# Patient Record
Sex: Female | Born: 1995 | Race: Black or African American | Hispanic: No | Marital: Single | Smoking: Never smoker
Health system: Southern US, Community
[De-identification: ages and names within clinical notes are randomized; demographics above are authoritative.]

## PROBLEM LIST (undated history)

## (undated) DIAGNOSIS — Z789 Other specified health status: Secondary | ICD-10-CM

## (undated) HISTORY — PX: ANTERIOR CRUCIATE LIGAMENT REPAIR: SHX115

## (undated) HISTORY — DX: Other specified health status: Z78.9

---

## 2017-04-05 ENCOUNTER — Encounter (HOSPITAL_COMMUNITY): Payer: Self-pay | Admitting: Family Medicine

## 2017-04-05 ENCOUNTER — Ambulatory Visit (HOSPITAL_COMMUNITY)
Admission: EM | Admit: 2017-04-05 | Discharge: 2017-04-05 | Disposition: A | Discharge status: Home / Self Care | Payer: No Typology Code available for payment source | Attending: Family Medicine | Admitting: Family Medicine

## 2017-04-05 DIAGNOSIS — J0100 Acute maxillary sinusitis, unspecified: Secondary | ICD-10-CM

## 2017-04-05 MED ORDER — AMOXICILLIN 875 MG PO TABS: 875.0000 mg | ORAL_TABLET | Freq: Two times a day (BID) | ORAL | 0 refills | Status: AC

## 2017-04-05 NOTE — ED Triage Notes (Signed)
Pt here for sinus congestion and facial pain x 3 weeks.

## 2017-04-05 NOTE — ED Provider Notes (Signed)
[facility]    CSN: [id number] Arrival date & time: 04/05/17  1308     History   Chief Complaint Chief Complaint  Patient presents with  . Sinus Problem    HPI Marisa Perez is a 21 y.o. female.   This is a 21 year old woman who comes to the urgent care center at [facility] for evaluation of sinus congestion and discomfort. She's had sinus congestion, intermittent epistaxis, and right facial and for headache pain for 3 weeks. She has not had problems with allergies in the past. She's been taking only ibuprofen to control the symptoms.  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 No pertinent past medical history.  There are no active problems to display for this patient.   History reviewed. No pertinent surgical history.  OB History    No data available       Home Medications    Prior to Admission medications   Medication Sig Start Date End Date Taking? Authorizing Provider  amoxicillin (AMOXIL) 875 MG tablet Take 1 tablet (875 mg total) by mouth 2 (two) times daily. 04/05/17   Elvina Sidle, MD    Family History History reviewed. No pertinent family history.  Social History Social History  Substance Use Topics  . Smoking status: Not on file  . Smokeless tobacco: Not on file  . Alcohol use Not on file     Allergies   Patient has no known allergies.   Review of Systems Review of Systems  Constitutional: Negative.   HENT: Positive for congestion, nosebleeds, sinus pain and sinus pressure.      Physical Exam Triage Vital Signs ED Triage Vitals  Enc Vitals Group     BP      Pulse      Resp      Temp      Temp src      SpO2      Weight      Height      Head Circumference      Peak Flow      Pain Score      Pain Loc      Pain Edu?      Excl. in GC?    No data found.   Updated Vital Signs BP (!) 142/65   Pulse 77   Temp 98.6 F (37  C)   Resp 18   SpO2 100%    Physical Exam  Constitutional: She is oriented to person, place, and time. She appears well-developed and well-nourished.  HENT:  Right Ear: External ear normal.  Left Ear: External ear normal.  Mouth/Throat: Oropharynx is clear and moist.  Marked intranasal edema with purulent nasal discharge  Eyes: Pupils are equal, round, and reactive to light. Conjunctivae are normal.  Neck: Normal range of motion. Neck supple.  Pulmonary/Chest: Effort normal.  Musculoskeletal: Normal range of motion.  Neurological: She is alert and oriented to person, place, and time.  Skin: Skin is warm and dry.  Nursing note and vitals reviewed.    UC Treatments / Results  Labs (all labs ordered are listed, but only abnormal results are displayed) Labs Reviewed - No data to display  EKG  EKG Interpretation None       Radiology No results found.  Procedures Procedures (including critical care time)  Medications Ordered in UC Medications - No data to display   Initial Impression / Assessment and  Plan / UC Course  I have reviewed the triage vital signs and the nursing notes.  Pertinent labs & imaging results that were available during my care of the patient were reviewed by me and considered in my medical decision making (see chart for details).     Final Clinical Impressions(s) / UC Diagnoses   Final diagnoses:  Acute maxillary sinusitis, recurrence not specified    New Prescriptions New Prescriptions   AMOXICILLIN (AMOXIL) 875 MG TABLET    Take 1 tablet (875 mg total) by mouth 2 (two) times daily.     Controlled Substance Prescriptions [place] Controlled Substance Registry consulted? Not Applicable   Elvina Sidle, MD 04/05/17 1339

## 2018-01-24 ENCOUNTER — Emergency Department (HOSPITAL_COMMUNITY): Admission: EM | Admit: 2018-01-24 | Discharge: 2018-01-24 | Discharge status: Against Medical Advice | Payer: Self-pay

## 2018-01-24 NOTE — ED Notes (Signed)
Pt called x2. Went in restroom and called pt name. No response.

## 2018-09-09 ENCOUNTER — Ambulatory Visit (INDEPENDENT_AMBULATORY_CARE_PROVIDER_SITE_OTHER): Payer: Medicaid Other | Admitting: Advanced Practice Midwife

## 2018-09-09 ENCOUNTER — Encounter: Payer: Self-pay | Admitting: Advanced Practice Midwife

## 2018-09-09 ENCOUNTER — Other Ambulatory Visit (HOSPITAL_COMMUNITY)
Admission: RE | Admit: 2018-09-09 | Discharge: 2018-09-09 | Disposition: A | Discharge status: Home / Self Care | Payer: Medicaid Other | Source: Ambulatory Visit | Attending: Advanced Practice Midwife | Admitting: Advanced Practice Midwife

## 2018-09-09 VITALS — BP 125/75 | HR 79 | Ht 63.5 in | Wt 159.8 lb

## 2018-09-09 DIAGNOSIS — Z3A01 Less than 8 weeks gestation of pregnancy: Secondary | ICD-10-CM | POA: Diagnosis not present

## 2018-09-09 DIAGNOSIS — Z3400 Encounter for supervision of normal first pregnancy, unspecified trimester: Secondary | ICD-10-CM | POA: Insufficient documentation

## 2018-09-09 DIAGNOSIS — O209 Hemorrhage in early pregnancy, unspecified: Secondary | ICD-10-CM

## 2018-09-09 LAB — POCT URINALYSIS DIPSTICK OB
Glucose, UA: NEGATIVE
LEUKOCYTES UA: NEGATIVE
PH UA: 7 (ref 5.0–8.0)
PROTEIN: NEGATIVE
Spec Grav, UA: 1.015 (ref 1.010–1.025)

## 2018-09-09 NOTE — Patient Instructions (Signed)
Prenatal Care Prenatal care is health care during pregnancy. It helps you and your unborn baby (fetus) stay as healthy as possible. Prenatal care may be provided by a midwife, a family practice health care provider, or a childbirth and pregnancy specialist (obstetrician). How does this affect me? During pregnancy, you will be closely monitored for any new conditions that might develop. To lower your risk of pregnancy complications, you and your health care provider will talk about any underlying conditions you have. How does this affect my baby? Early and consistent prenatal care increases the chance that your baby will be healthy during pregnancy. Prenatal care lowers the risk that your baby will be:  Born early (prematurely).  Smaller than expected at birth (small for gestational age). What can I expect at the first prenatal care visit? Your first prenatal care visit will likely be the longest. You should schedule your first prenatal care visit as soon as you know that you are pregnant. Your first visit is a good time to talk about any questions or concerns you have about pregnancy. At your visit, you and your health care provider will talk about:  Your medical history, including: ? Any past pregnancies. ? Your family's medical history. ? The baby's father's medical history. ? Any long-term (chronic) health conditions you have and how you manage them. ? Any surgeries or procedures you have had. ? Any current over-the-counter or prescription medicines, herbs, or supplements you are taking.  Other factors that could pose a risk to your baby, including:  Your home setting and your stress levels, including: ? Exposure to abuse or violence. ? Household financial strain. ? Mental health conditions you have.  Your daily health habits, including diet and exercise. Your health care provider will also:  Measure your weight, height, and blood pressure.  Do a physical exam, including a pelvic  and breast exam.  Perform blood tests and urine tests to check for: ? Urinary tract infection. ? Sexually transmitted infections (STIs). ? Low iron levels in your blood (anemia). ? Blood type and certain proteins on red blood cells (Rh antibodies). ? Infections and immunity to viruses, such as hepatitis B and rubella. ? HIV (human immunodeficiency virus).  Do an ultrasound to confirm your baby's growth and development and to help predict your estimated due date (EDD). This ultrasound is done with a probe that is inserted into the vagina (transvaginal ultrasound).  Discuss your options for genetic screening.  Give you information about how to keep yourself and your baby healthy, including: ? Nutrition and taking vitamins. ? Physical activity. ? How to manage pregnancy symptoms such as nausea and vomiting (morning sickness). ? Infections and substances that may be harmful to your baby and how to avoid them. ? Food safety. ? Dental care. ? Working. ? Travel. ? Warning signs to watch for and when to call your health care provider. How often will I have prenatal care visits? After your first prenatal care visit, you will have regular visits throughout your pregnancy. The visit schedule is often as follows:  Up to week 28 of pregnancy: once every 4 weeks.  28-36 weeks: once every 2 weeks.  After 36 weeks: every week until delivery. Some women may have visits more or less often depending on any underlying health conditions and the health of the baby. Keep all follow-up and prenatal care visits as told by your health care provider. This is important. What happens during routine prenatal care visits? Your health care provider will:    Measure your weight and blood pressure.  Check for fetal heart sounds.  Measure the height of your uterus in your abdomen (fundal height). This may be measured starting around week 20 of pregnancy.  Check the position of your baby inside your  uterus.  Ask questions about your diet, sleeping patterns, and whether you can feel the baby move.  Review warning signs to watch for and signs of labor.  Ask about any pregnancy symptoms you are having and how you are dealing with them. Symptoms may include: ? Headaches. ? Nausea and vomiting. ? Vaginal discharge. ? Swelling. ? Fatigue. ? Constipation. ? Any discomfort, including back or pelvic pain. Make a list of questions to ask your health care provider at your routine visits. What tests might I have during prenatal care visits? You may have blood, urine, and imaging tests throughout your pregnancy, such as:  Urine tests to check for glucose, protein, or signs of infection.  Glucose tests to check for a form of diabetes that can develop during pregnancy (gestational diabetes mellitus). This is usually done around week 24 of pregnancy.  An ultrasound to check your baby's growth and development and to check for birth defects. This is usually done around week 20 of pregnancy.  A test to check for group B strep (GBS) infection. This is usually done around week 36 of pregnancy.  Genetic testing. This may include blood or imaging tests, such as an ultrasound. Some genetic tests are done during the first trimester and some are done during the second trimester. What else can I expect during prenatal care visits? Your health care provider may recommend getting certain vaccines during pregnancy. These may include:  A yearly flu shot (annual influenza vaccine). This is especially important if you will be pregnant during flu season.  Tdap (tetanus, diphtheria, pertussis) vaccine. Getting this vaccine during pregnancy can protect your baby from whooping cough (pertussis) after birth. This vaccine may be recommended between weeks 27 and 36 of pregnancy. Later in your pregnancy, your health care provider may give you information about:  Childbirth and breastfeeding classes.  Choosing a  health care provider for your baby.  Umbilical cord banking.  Breastfeeding.  Birth control after your baby is born.  The hospital labor and delivery unit and how to tour it.  Registering at the hospital before you go into labor. Where to find more information  Office on Women's Health: TravelLesson.ca  American Pregnancy Association: americanpregnancy.org  March of Dimes: marchofdimes.org Summary  Prenatal care helps you and your baby stay as healthy as possible during pregnancy.  Your first prenatal care visit will most likely be the longest.  You will have visits and tests throughout your pregnancy to monitor your health and your baby's health.  Bring a list of questions to your visits to ask your health care provider.  Make sure to keep all follow-up and prenatal care visits with your health care provider. This information is not intended to replace advice given to you by your health care provider. Make sure you discuss any questions you have with your health care provider. Document Released: 08/09/2003 Document Revised: 08/05/2017 Document Reviewed: 08/05/2017 Elsevier Interactive Patient Education  2019 [employer]. First Trimester of Pregnancy  The first trimester of pregnancy is from week 1 until the end of week 13 (months 1 through 3). During this time, your baby will begin to develop inside you. At 6-8 weeks, the eyes and face are formed, and the heartbeat can be seen  on ultrasound. At the end of 12 weeks, all the baby's organs are formed. Prenatal care is all the medical care you receive before the birth of your baby. Make sure you get good prenatal care and follow all of your doctor's instructions. Follow these instructions at home: Medicines  Take over-the-counter and prescription medicines only as told by your doctor. Some medicines are safe and some medicines are not safe during pregnancy.  Take a prenatal vitamin that contains at least 600 micrograms (mcg)  of folic acid.  If you have trouble pooping (constipation), take medicine that will make your stool soft (stool softener) if your doctor approves. Eating and drinking   Eat regular, healthy meals.  Your doctor will tell you the amount of weight gain that is right for you.  Avoid raw meat and uncooked cheese.  If you feel sick to your stomach (nauseous) or throw up (vomit): ? Eat 4 or 5 small meals a day instead of 3 large meals. ? Try eating a few soda crackers. ? Drink liquids between meals instead of during meals.  To prevent constipation: ? Eat foods that are high in fiber, like fresh fruits and vegetables, whole grains, and beans. ? Drink enough fluids to keep your pee (urine) clear or pale yellow. Activity  Exercise only as told by your doctor. Stop exercising if you have cramps or pain in your lower belly (abdomen) or low back.  Do not exercise if it is too hot, too humid, or if you are in a place of great height (high altitude).  Try to avoid standing for long periods of time. Move your legs often if you must stand in one place for a long time.  Avoid heavy lifting.  Wear low-heeled shoes. Sit and stand up straight.  You can have sex unless your doctor tells you not to. Relieving pain and discomfort  Wear a good support bra if your breasts are sore.  Take warm water baths (sitz baths) to soothe pain or discomfort caused by hemorrhoids. Use hemorrhoid cream if your doctor says it is okay.  Rest with your legs raised if you have leg cramps or low back pain.  If you have puffy, bulging veins (varicose veins) in your legs: ? Wear support hose or compression stockings as told by your doctor. ? Raise (elevate) your feet for 15 minutes, 3-4 times a day. ? Limit salt in your food. Prenatal care  Schedule your prenatal visits by the twelfth week of pregnancy.  Write down your questions. Take them to your prenatal visits.  Keep all your prenatal visits as told by your  doctor. This is important. Safety  Wear your seat belt at all times when driving.  Make a list of emergency phone numbers. The list should include numbers for family, friends, the hospital, and police and fire departments. General instructions  Ask your doctor for a referral to a local prenatal class. Begin classes no later than at the start of month 6 of your pregnancy.  Ask for help if you need counseling or if you need help with nutrition. Your doctor can give you advice or tell you where to go for help.  Do not use hot tubs, steam rooms, or saunas.  Do not douche or use tampons or scented sanitary pads.  Do not cross your legs for long periods of time.  Avoid all herbs and alcohol. Avoid drugs that are not approved by your doctor.  Do not use any tobacco products, including cigarettes,  chewing tobacco, and electronic cigarettes. If you need help quitting, ask your doctor. You may get counseling or other support to help you quit.  Avoid cat litter boxes and soil used by cats. These carry germs that can cause birth defects in the baby and can cause a loss of your baby (miscarriage) or stillbirth.  Visit your dentist. At home, brush your teeth with a soft toothbrush. Be gentle when you floss. Contact a doctor if:  You are dizzy.  You have mild cramps or pressure in your lower belly.  You have a nagging pain in your belly area.  You continue to feel sick to your stomach, you throw up, or you have watery poop (diarrhea).  You have a bad smelling fluid coming from your vagina.  You have pain when you pee (urinate).  You have increased puffiness (swelling) in your face, hands, legs, or ankles. Get help right away if:  You have a fever.  You are leaking fluid from your vagina.  You have spotting or bleeding from your vagina.  You have very bad belly cramping or pain.  You gain or lose weight rapidly.  You throw up blood. It may look like coffee grounds.  You are  around people who have [place] measles, fifth disease, or chickenpox.  You have a very bad headache.  You have shortness of breath.  You have any kind of trauma, such as from a fall or a car accident. Summary  The first trimester of pregnancy is from week 1 until the end of week 13 (months 1 through 3).  To take care of yourself and your unborn baby, you will need to eat healthy meals, take medicines only if your doctor tells you to do so, and do activities that are safe for you and your baby.  Keep all follow-up visits as told by your doctor. This is important as your doctor will have to ensure that your baby is healthy and growing well. This information is not intended to replace advice given to you by your health care provider. Make sure you discuss any questions you have with your health care provider. Document Released: 01/23/2008 Document Revised: 08/14/2016 Document Reviewed: 08/14/2016 Elsevier Interactive Patient Education  2019 [employer].

## 2018-09-09 NOTE — Progress Notes (Signed)
  Subjective:    Marisa Perez is a G1P0 [redacted]w[redacted]d being seen today for her first obstetrical visit.  Her obstetrical history is significant for primigravida. Patient Does intend to breast pump. Pregnancy history fully reviewed.  Patient reports brown spotting for 2-3 weeks.  Vitals:   09/09/18 0948 09/09/18 0949  BP: 125/75   Pulse: 79   Weight: 72.5 kg   Height:  5' 3.5" (1.613 m)    HISTORY: OB History  Gravida Para Term Preterm AB Living  1            SAB TAB Ectopic Multiple Live Births               # Outcome Date GA Lbr Len/2nd Weight Sex Delivery Anes PTL Lv  1 Current            History reviewed. No pertinent past medical history. Past Surgical History:  Procedure Laterality Date  . ANTERIOR CRUCIATE LIGAMENT REPAIR     History reviewed. No pertinent family history.   Exam    Uterus:     Pelvic Exam:    Perineum: No Hemorrhoids, Normal Perineum   Vulva: Bartholin's, Urethra, Skene's normal   Vagina:  normal mucosa, brown/tan discharge   pH:    Cervix: no cervical motion tenderness   Adnexa: no mass, fullness, tenderness   Bony Pelvis: gynecoid  System: Breast:  normal appearance, no masses or tenderness   Skin: normal coloration and turgor, no rashes    Neurologic: oriented, grossly non-focal   Extremities: normal strength, tone, and muscle mass   HEENT neck supple with midline trachea   Mouth/Teeth mucous membranes moist, pharynx normal without lesions   Neck supple   Cardiovascular: regular rate and rhythm   Respiratory:  appears well, vitals normal, no respiratory distress, acyanotic, normal RR, ear and throat exam is normal, neck free of mass or lymphadenopathy, chest clear, no wheezing, crepitations, rhonchi, normal symmetric air entry   Abdomen: soft, non-tender; bowel sounds normal; no masses,  no organomegaly   Urinary: urethral meatus normal   Informal bedside ultrasound done by me. FHR 160s Normal GS with yolk sac visible CRL measures [redacted]w[redacted]d    Assessment:    Pregnancy: G1P0 Patient Active Problem List   Diagnosis Date Noted  . Supervision of normal first pregnancy, antepartum 09/09/2018    2.  First trimester bleeding, brown.  With live fetus and light brown discharge, suspect small subchorionic hemorrhage.    Plan:     Initial labs drawn. Prenatal vitamins. Problem list reviewed and updated. Genetic Screening discussed First Screen: plans panorama.  Ultrasound discussed; fetal survey: requested.  Follow up in 4 weeks. 50% of 30 min visit spent on counseling and coordination of care.   Welcomed to practice. Discussed providers, hospital rotating staff, and learners Routines reviewed FOB present and supportive   Wynelle Bourgeois 09/09/2018

## 2018-09-09 NOTE — Progress Notes (Signed)
poc

## 2018-09-10 LAB — GC/CHLAMYDIA PROBE AMP (~~LOC~~) NOT AT ARMC
CHLAMYDIA, DNA PROBE: NEGATIVE
Neisseria Gonorrhea: NEGATIVE

## 2018-09-11 LAB — URINE CULTURE, OB REFLEX

## 2018-09-11 LAB — CULTURE, OB URINE

## 2018-09-18 LAB — HEMOGLOBINOPATHY EVALUATION
Ferritin: 78 ng/mL (ref 15–150)
HGB A2 QUANT: 1.9 % (ref 1.8–3.2)
HGB F QUANT: 0 % (ref 0.0–2.0)
Hgb A: 98.1 % (ref 96.4–98.8)
Hgb C: 0 %
Hgb S: 0 %
Hgb Solubility: NEGATIVE
Hgb Variant: 0 %

## 2018-09-18 LAB — SMN1 COPY NUMBER ANALYSIS (SMA CARRIER SCREENING)

## 2018-09-18 LAB — OBSTETRIC PANEL, INCLUDING HIV
Antibody Screen: NEGATIVE
BASOS: 0 %
Basophils Absolute: 0.1 10*3/uL (ref 0.0–0.2)
EOS (ABSOLUTE): 0 10*3/uL (ref 0.0–0.4)
EOS: 0 %
HEMATOCRIT: 34.8 % (ref 34.0–46.6)
HEP B S AG: NEGATIVE
HIV Screen 4th Generation wRfx: NONREACTIVE
Hemoglobin: 11.4 g/dL (ref 11.1–15.9)
Immature Grans (Abs): 0 10*3/uL (ref 0.0–0.1)
Immature Granulocytes: 0 %
LYMPHS ABS: 2.1 10*3/uL (ref 0.7–3.1)
Lymphs: 19 %
MCH: 26.9 pg (ref 26.6–33.0)
MCHC: 32.8 g/dL (ref 31.5–35.7)
MCV: 82 fL (ref 79–97)
MONOCYTES: 5 %
Monocytes Absolute: 0.6 10*3/uL (ref 0.1–0.9)
Neutrophils Absolute: 8.6 10*3/uL — ABNORMAL HIGH (ref 1.4–7.0)
Neutrophils: 76 %
Platelets: 224 10*3/uL (ref 150–450)
RBC: 4.24 x10E6/uL (ref 3.77–5.28)
RDW: 13 % (ref 11.7–15.4)
RH TYPE: POSITIVE
RPR: NONREACTIVE
RUBELLA: 17.3 {index} (ref >0.99)
WBC: 11.4 10*3/uL — ABNORMAL HIGH (ref 3.4–10.8)

## 2018-10-14 ENCOUNTER — Encounter: Payer: Self-pay | Admitting: Advanced Practice Midwife

## 2018-10-14 ENCOUNTER — Ambulatory Visit (INDEPENDENT_AMBULATORY_CARE_PROVIDER_SITE_OTHER): Payer: Medicaid Other | Admitting: Advanced Practice Midwife

## 2018-10-14 ENCOUNTER — Other Ambulatory Visit (HOSPITAL_COMMUNITY)
Admission: RE | Admit: 2018-10-14 | Discharge: 2018-10-14 | Disposition: A | Discharge status: Home / Self Care | Payer: Medicaid Other | Source: Ambulatory Visit | Attending: Advanced Practice Midwife | Admitting: Advanced Practice Midwife

## 2018-10-14 VITALS — BP 128/70 | HR 84 | Wt 155.1 lb

## 2018-10-14 DIAGNOSIS — Z3401 Encounter for supervision of normal first pregnancy, first trimester: Secondary | ICD-10-CM

## 2018-10-14 DIAGNOSIS — O26891 Other specified pregnancy related conditions, first trimester: Secondary | ICD-10-CM | POA: Insufficient documentation

## 2018-10-14 DIAGNOSIS — Z3A12 12 weeks gestation of pregnancy: Secondary | ICD-10-CM

## 2018-10-14 DIAGNOSIS — N898 Other specified noninflammatory disorders of vagina: Secondary | ICD-10-CM

## 2018-10-14 DIAGNOSIS — Z3400 Encounter for supervision of normal first pregnancy, unspecified trimester: Secondary | ICD-10-CM

## 2018-10-14 NOTE — Progress Notes (Signed)
   PRENATAL VISIT NOTE  Subjective:  Syndey Perez is a 23 y.o. G1P0 at [redacted]w[redacted]d being seen today for ongoing prenatal care.  She is currently monitored for the following issues for this low-risk pregnancy and has Supervision of normal first pregnancy, antepartum and First trimester bleeding on their problem list.  Patient reports vaginal discharge, no itching or odor, just worried because it is more than usual.  Contractions: Not present. Vag. Bleeding: None.  Movement: Absent. Denies leaking of fluid.   The following portions of the patient's history were reviewed and updated as appropriate: allergies, current medications, past family history, past medical history, past social history, past surgical history and problem list. Problem list updated.  Objective:   Vitals:   10/14/18 0907  BP: 128/70  Pulse: 84  Weight: 70.3 kg    Fetal Status: Fetal Heart Rate (bpm): 161 Fundal Height: 12 cm Movement: Absent     General:  Alert, oriented and cooperative. Patient is in no acute distress.  Skin: Skin is warm and dry. No rash noted.   Cardiovascular: Normal heart rate noted  Respiratory: Normal respiratory effort, no problems with respiration noted  Abdomen: Soft, gravid, appropriate for gestational age.  Pain/Pressure: Absent     Pelvic: Cervical exam deferred        Extremities: Normal range of motion.  Edema: None  Mental Status: Normal mood and affect. Normal behavior. Normal judgment and thought content.   Assessment and Plan:  Pregnancy: G1P0 at [redacted]w[redacted]d  1. Supervision of normal first pregnancy, antepartum      Panorama drawn today  2. Vaginal discharge during pregnancy in first trimester      Wet prep sent      No erethema or whiff  Preterm labor symptoms and general obstetric precautions including but not limited to vaginal bleeding, contractions, leaking of fluid and fetal movement were reviewed in detail with the patient. Please refer to After Visit Summary for other  counseling recommendations.  Return in about 4 weeks (around 11/11/2018) for [facility].  RTO 4 weeks  Wynelle Bourgeois, CNM

## 2018-10-14 NOTE — Patient Instructions (Signed)

## 2018-10-14 NOTE — Addendum Note (Signed)
Addended by: Mikey Bussing on: 10/14/2018 03:11 PM   Modules accepted: Orders

## 2018-10-14 NOTE — Addendum Note (Signed)
Addended by: Mikey Bussing on: 10/14/2018 10:36 AM   Modules accepted: Orders

## 2018-10-15 LAB — CERVICOVAGINAL ANCILLARY ONLY: CANDIDA VAGINITIS: NEGATIVE

## 2018-10-23 ENCOUNTER — Telehealth: Payer: Self-pay

## 2018-10-23 DIAGNOSIS — R898 Other abnormal findings in specimens from other organs, systems and tissues: Secondary | ICD-10-CM

## 2018-10-23 NOTE — Telephone Encounter (Signed)
Per Panorama patient has high risk panorama and suggests getting ultrasound done.  "Results suggest either a vanishing twin, unrecognized multiple gestation, or an increased risk of fetal triploidy"  Discussed with Dr. Adrian Blackwater and patient will need ultrasound with MFM and genetics consult. Armandina Stammer RN

## 2018-10-23 NOTE — Telephone Encounter (Signed)
Patient called back and given appointment information with MFM. Patient made aware she should go to her appointment tomorrow [phone] at Maternal fetal medicine at [address] [facility]. Patient made aware that she will have ultrasound and meet with genetics counselor.   Patient's mother on phone with patient. Explained abnormal panorama results to them again. Assured them they will get more answers tomorrow once ultrasound and meeting with Genetics counselor is done. Encouraged patient's mother to go with patient. They both state understanding and accept appointment for tomorrow. Armandina Stammer RN

## 2018-10-24 ENCOUNTER — Ambulatory Visit (HOSPITAL_COMMUNITY)
Admission: RE | Admit: 2018-10-24 | Discharge: 2018-10-24 | Disposition: A | Discharge status: Home / Self Care | Payer: Medicaid Other | Source: Ambulatory Visit | Attending: Obstetrics and Gynecology | Admitting: Obstetrics and Gynecology

## 2018-10-24 ENCOUNTER — Encounter (HOSPITAL_COMMUNITY): Payer: Self-pay

## 2018-10-24 ENCOUNTER — Other Ambulatory Visit (HOSPITAL_COMMUNITY): Payer: Medicaid Other

## 2018-10-24 ENCOUNTER — Ambulatory Visit (HOSPITAL_COMMUNITY): Payer: Medicaid Other

## 2018-10-24 ENCOUNTER — Other Ambulatory Visit: Payer: Self-pay | Admitting: Family Medicine

## 2018-10-24 ENCOUNTER — Ambulatory Visit (HOSPITAL_COMMUNITY): Payer: Medicaid Other | Admitting: *Deleted

## 2018-10-24 VITALS — BP 126/69 | HR 79 | Wt 156.4 lb

## 2018-10-24 DIAGNOSIS — Z363 Encounter for antenatal screening for malformations: Secondary | ICD-10-CM | POA: Diagnosis not present

## 2018-10-24 DIAGNOSIS — O0990 Supervision of high risk pregnancy, unspecified, unspecified trimester: Secondary | ICD-10-CM | POA: Insufficient documentation

## 2018-10-24 DIAGNOSIS — R898 Other abnormal findings in specimens from other organs, systems and tissues: Secondary | ICD-10-CM | POA: Diagnosis not present

## 2018-10-24 DIAGNOSIS — O289 Unspecified abnormal findings on antenatal screening of mother: Secondary | ICD-10-CM | POA: Diagnosis not present

## 2018-10-24 DIAGNOSIS — Z3A14 14 weeks gestation of pregnancy: Secondary | ICD-10-CM | POA: Diagnosis not present

## 2018-10-27 ENCOUNTER — Encounter: Payer: Self-pay | Admitting: Family Medicine

## 2018-10-27 DIAGNOSIS — O30049 Twin pregnancy, dichorionic/diamniotic, unspecified trimester: Secondary | ICD-10-CM | POA: Insufficient documentation

## 2018-10-31 ENCOUNTER — Telehealth: Payer: Self-pay

## 2018-10-31 ENCOUNTER — Telehealth (HOSPITAL_COMMUNITY): Payer: Self-pay | Admitting: *Deleted

## 2018-10-31 NOTE — Telephone Encounter (Signed)
Marisa Perez from MFM called the office wanting to know if Panorma was made aware that the pt is having twins because pt's Panorma was abnormal. Lawson Fiscal made aware that Armandina Stammer spoke with pt on Monday 10/27/18. Understanding was voiced.  chiquita l wilson, CMA

## 2018-11-10 ENCOUNTER — Telehealth: Payer: Self-pay

## 2018-11-10 DIAGNOSIS — O30042 Twin pregnancy, dichorionic/diamniotic, second trimester: Secondary | ICD-10-CM

## 2018-11-10 NOTE — Telephone Encounter (Signed)
Patient called and made aware we will do telehealth visit in the tomorrow 11-11-2018. We will go ahead and set up her anatomy ultrasound and report this to her tomorrow during visit.   Patient states she is doing well. Armandina Stammer RN

## 2018-11-11 ENCOUNTER — Ambulatory Visit (INDEPENDENT_AMBULATORY_CARE_PROVIDER_SITE_OTHER): Payer: Medicaid Other | Admitting: Advanced Practice Midwife

## 2018-11-11 ENCOUNTER — Encounter: Payer: Self-pay | Admitting: Advanced Practice Midwife

## 2018-11-11 DIAGNOSIS — O30042 Twin pregnancy, dichorionic/diamniotic, second trimester: Secondary | ICD-10-CM | POA: Diagnosis not present

## 2018-11-11 DIAGNOSIS — Z3A16 16 weeks gestation of pregnancy: Secondary | ICD-10-CM

## 2018-11-11 NOTE — Progress Notes (Signed)
TELE HEALTH VISIT Patient scheduled for anatomy ultrasound 12-02-2018 at 12:30 (2 hour appointment; and remind to only have pt come no visitors).

## 2018-11-11 NOTE — Patient Instructions (Signed)

## 2018-11-11 NOTE — Progress Notes (Addendum)
   PRENATAL VISIT NOTE  Marisa Stammer RN and Myself participated  TELEHEALTH VIRTUAL OBSTETRICS VISIT ENCOUNTER NOTE  I connected with Marisa Perez on 11/12/18 at 10:00 AM EDT by telephone at home and verified that I am speaking with the correct person using two identifiers.   I discussed the limitations, risks, security and privacy concerns of performing an evaluation and management service by telephone and the availability of in person appointments. I also discussed with the patient that there may be a patient responsible charge related to this service. The patient expressed understanding and agreed to proceed.  Subjective:  Marisa Perez is a 23 y.o. G1P0 at [redacted]w[redacted]d being seen today for ongoing prenatal care.  She is currently monitored for the following issues for this high-risk pregnancy and has Supervision of normal first pregnancy, antepartum; First trimester bleeding; and Twin pregnancy, twins dichorionic and diamniotic on their problem list.  Patient reports no complaints.  Does report that her belly button is sticking out but is not painful  Contractions: Not present. Vag. Bleeding: None.  Movement: Present. Denies leaking of fluid.   The following portions of the patient's history were reviewed and updated as appropriate: allergies, current medications, past family history, past medical history, past social history, past surgical history and problem list.   Objective:  There were no vitals filed for this visit.   General:  Alert, oriented and cooperative.   Mental Status: Normal mood and affect perceived. Normal judgment and thought content.  Rest of physical exam deferred due to type of encounter Fetal Status: Fetal Heart Rate (bpm): not measured   Movement: Present     States feels well Feels a little fluttering  No leaking or bleeding No cramps  Assessment and Plan:  Pregnancy: G1P0 at [redacted]w[redacted]d 1. Dichorionic diamniotic twin pregnancy in second trimester     Warning  signs reviewed     Panorama recalculated for twins and is normal     Will do AFP when she comes for her [place] - [place] MFM OB DETAIL ADDL GEST +14 WK; Future  Preterm labor symptoms and general obstetric precautions including but not limited to vaginal bleeding, contractions, leaking of fluid and fetal movement were reviewed in detail with the patient. Please refer to After Visit Summary for other counseling recommendations.   No follow-ups on file.  Future Appointments  Date Time Provider Department Center  12/02/2018 12:30 PM [facility] [place] 1 [facility]    Wynelle Bourgeois, CNM [facility] [employer], [facility]

## 2018-11-13 NOTE — Telephone Encounter (Signed)
Opened in error

## 2018-11-14 ENCOUNTER — Ambulatory Visit (HOSPITAL_COMMUNITY): Payer: Self-pay | Admitting: Advanced Practice Midwife

## 2018-12-02 ENCOUNTER — Other Ambulatory Visit (HOSPITAL_COMMUNITY): Payer: Self-pay | Admitting: *Deleted

## 2018-12-02 ENCOUNTER — Ambulatory Visit (HOSPITAL_COMMUNITY)
Admission: RE | Admit: 2018-12-02 | Discharge: 2018-12-02 | Disposition: A | Discharge status: Home / Self Care | Payer: Medicaid Other | Source: Ambulatory Visit | Attending: Family Medicine | Admitting: Family Medicine

## 2018-12-02 ENCOUNTER — Encounter (HOSPITAL_COMMUNITY): Payer: Self-pay | Admitting: *Deleted

## 2018-12-02 ENCOUNTER — Ambulatory Visit (HOSPITAL_COMMUNITY): Payer: Medicaid Other | Admitting: *Deleted

## 2018-12-02 ENCOUNTER — Encounter (HOSPITAL_COMMUNITY): Payer: Self-pay

## 2018-12-02 ENCOUNTER — Other Ambulatory Visit: Payer: Self-pay

## 2018-12-02 VITALS — Temp 98.7°F

## 2018-12-02 DIAGNOSIS — O0990 Supervision of high risk pregnancy, unspecified, unspecified trimester: Secondary | ICD-10-CM | POA: Insufficient documentation

## 2018-12-02 DIAGNOSIS — O30042 Twin pregnancy, dichorionic/diamniotic, second trimester: Secondary | ICD-10-CM | POA: Insufficient documentation

## 2018-12-02 DIAGNOSIS — O30049 Twin pregnancy, dichorionic/diamniotic, unspecified trimester: Secondary | ICD-10-CM

## 2018-12-02 DIAGNOSIS — Z3A19 19 weeks gestation of pregnancy: Secondary | ICD-10-CM | POA: Diagnosis not present

## 2018-12-02 DIAGNOSIS — Z363 Encounter for antenatal screening for malformations: Secondary | ICD-10-CM | POA: Diagnosis not present

## 2018-12-02 DIAGNOSIS — R898 Other abnormal findings in specimens from other organs, systems and tissues: Secondary | ICD-10-CM | POA: Diagnosis present

## 2018-12-02 IMAGING — US US MFM OB DETAIL ADDL GEST +14 WK
1 series · 16 of 28 positions shown · non-contrast
Comparison: none

[Series 1: us mfm ob detail addl gest +14 wk · 16 of 219 slices shown]
[im 1/219]
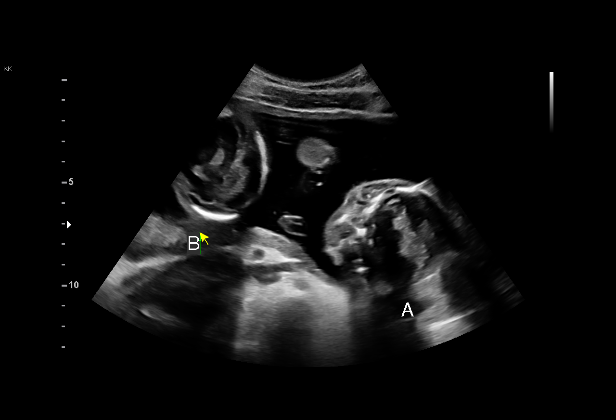
[im 17/219]
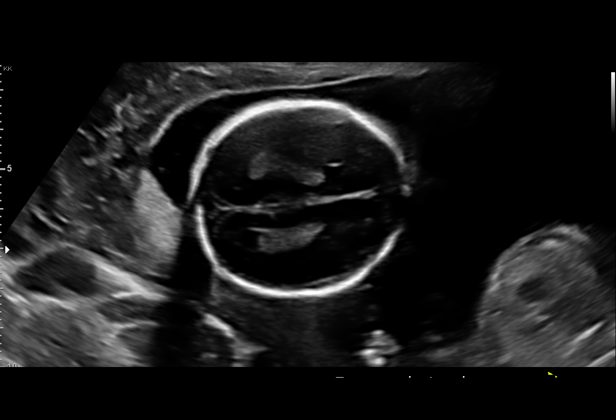
[im 33/219]
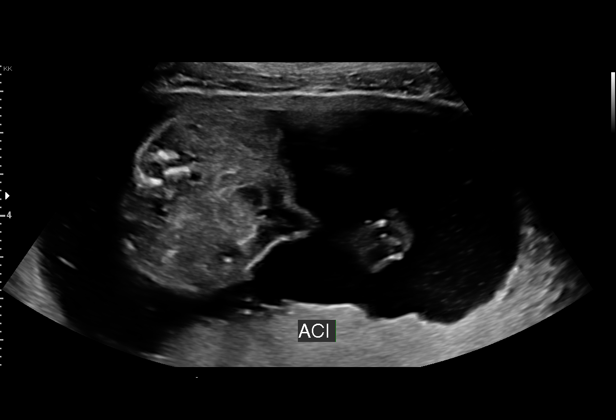
[im 49/219]
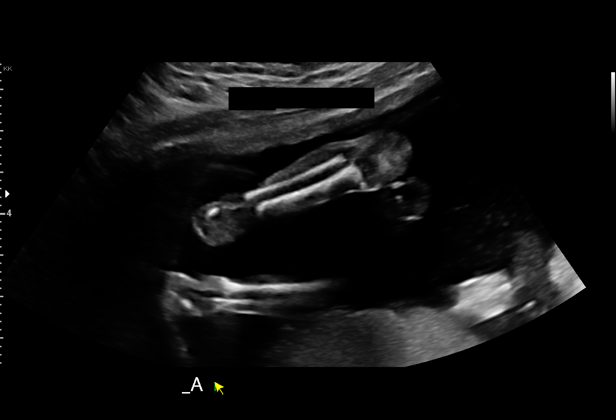
[im 57/219]
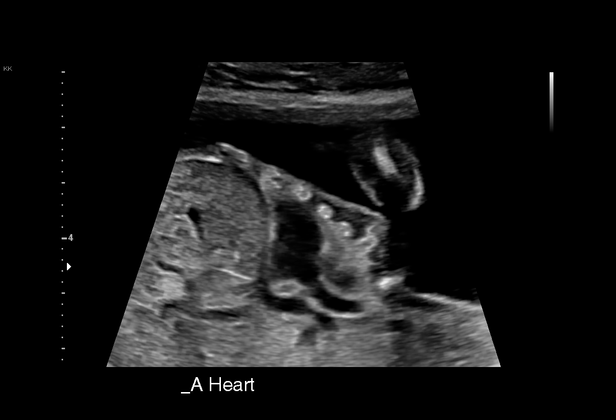
[im 73/219]
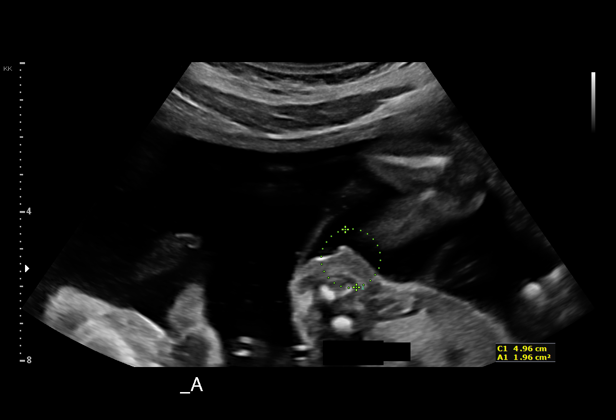
[im 89/219]
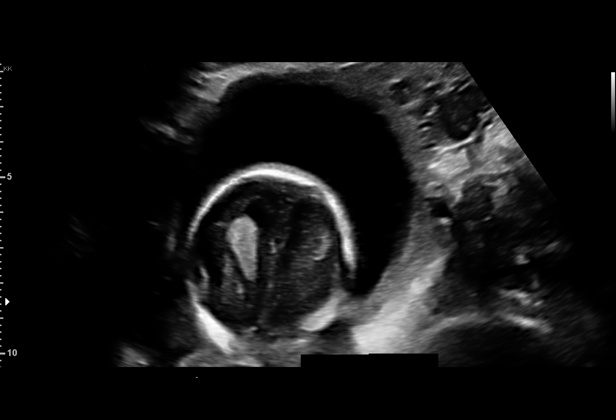
[im 105/219]
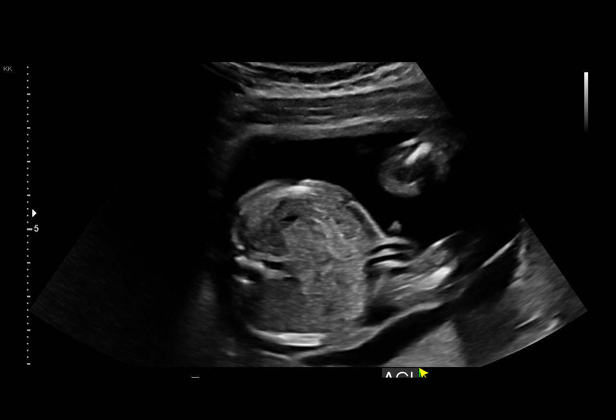
[im 114/219]
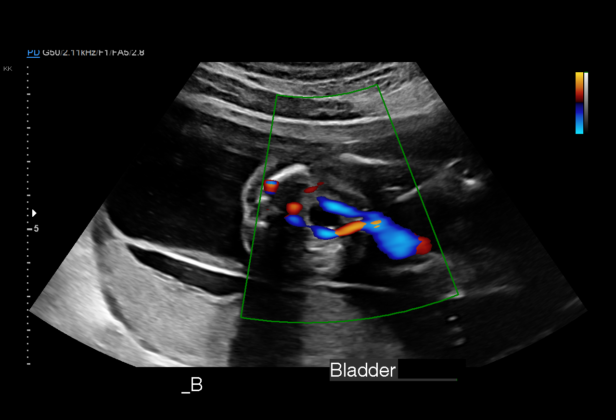
[im 130/219]
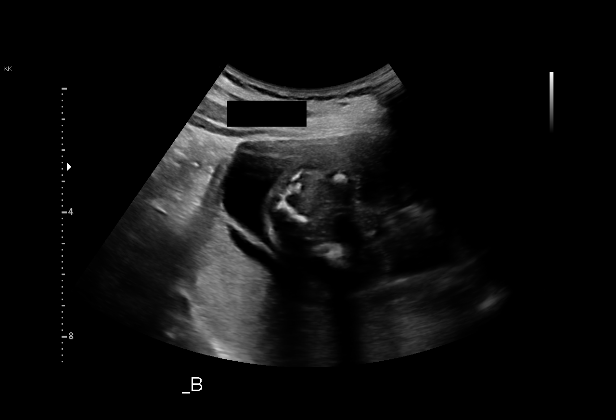
[im 146/219]
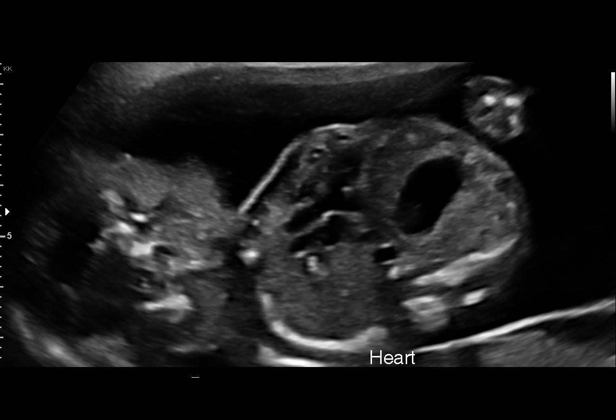
[im 162/219]
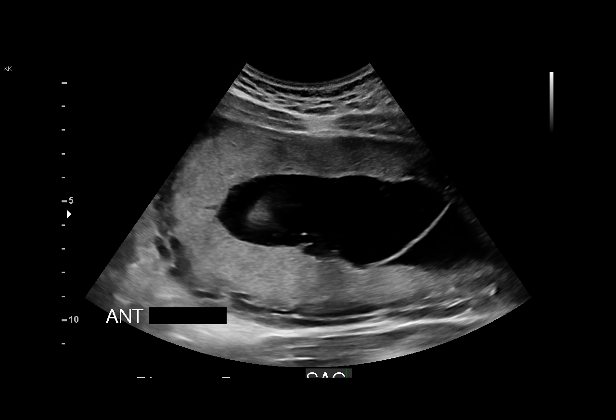
[im 170/219]
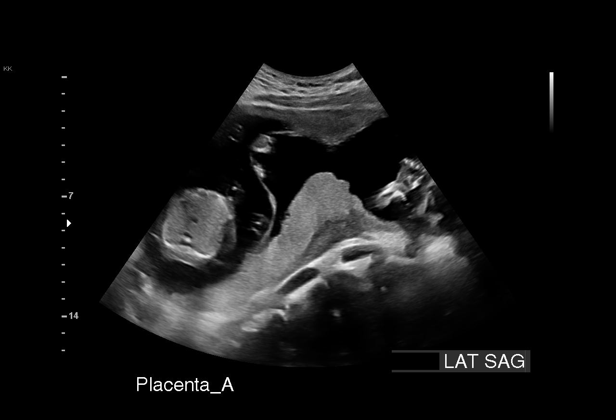
[im 186/219]
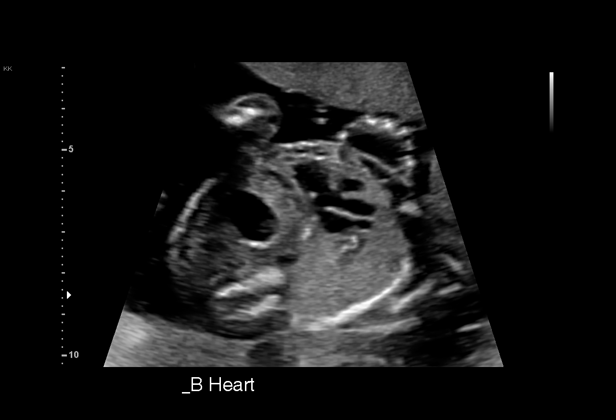
[im 202/219]
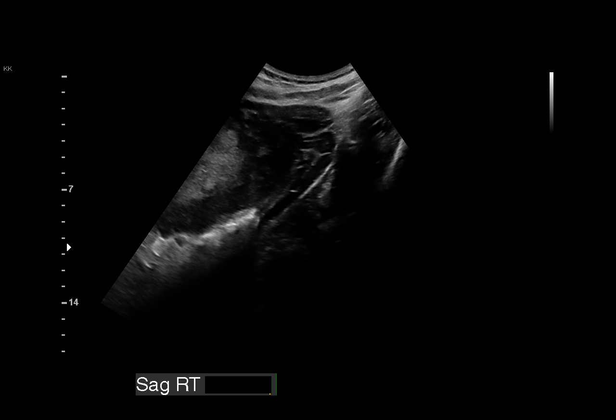
[im 219/219]
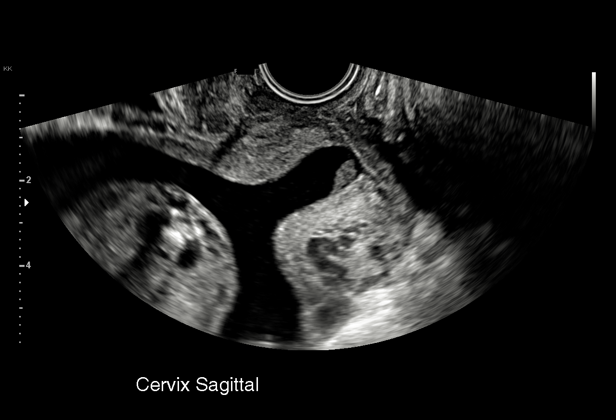

[16 of 28 positions shown; findings below may reference images not displayed]

[REDACTED]care - [REDACTED]

     +14 WK
 ----------------------------------------------------------------------

 ----------------------------------------------------------------------
Indications

  19 weeks gestation of pregnancy
  Encounter for antenatal screening for          [1L]
  malformations
  Twin pregnancy, di/di, second                  [1L]
  trimester(NIPS Low Risk)
 ----------------------------------------------------------------------
Fetal Evaluation (Fetus A)

 Num Of Fetuses:         2
 Fetal Heart Rate(bpm):  165
 Cardiac Activity:       Observed
 Fetal Lie:              Lower Left Fetus
 Presentation:           Cephalic
 Placenta:               Left lateral
 P. Cord Insertion:      Not well visualized
 Membrane Desc:      Dividing Membrane seen - Dichorionic.

 Amniotic Fluid
 AFI FV:      Within normal limits

                             Largest Pocket(cm)

Biometry (Fetus A)

 BPD:      48.6  mm     G. Age:  20w 5d         86  %    CI:         79.9   %    70 - 86
                                                         FL/HC:      18.2   %    16.8 -
 HC:      171.8  mm     G. Age:  19w 5d         45  %    HC/AC:      1.15        1.09 -
 AC:      149.8  mm     G. Age:  20w 1d         62  %    FL/BPD:     64.2   %
 FL:       31.2  mm     G. Age:  19w 5d         42  %    FL/AC:      20.8   %    20 - 24
 NFT:       2.8  mm

 Est. FW:     325  gm    0 lb 11 oz      52  %     FW Discordancy        12  %
OB History

 Gravidity:    1         Term:   0        Prem:   0        SAB:   0
 TOP:          0       Ectopic:  0        Living: 0
Gestational Age (Fetus A)

 LMP:           19w 5d        Date:  [DATE]                 EDD:   [DATE]
 U/S Today:     20w 1d                                        EDD:   [DATE]
 Best:          19w 5d     Det. By:  LMP  ([DATE])          EDD:   [DATE]
Anatomy (Fetus A)

 Cranium:               Appears normal         Aortic Arch:            Appears normal
 Cavum:                 Not well visualized    Ductal Arch:            Not well visualized
 Ventricles:            Appears normal         Diaphragm:              Appears normal
 Choroid Plexus:        Appears normal         Stomach:                Appears normal, left
                                                                       sided
 Cerebellum:            Appears normal         Abdomen:                Appears normal
 Posterior Fossa:       Appears normal         Abdominal Wall:         Appears nml (cord
                                                                       insert, abd wall)
 Nuchal Fold:           Appears normal         Cord Vessels:           Appears normal (3
                                                                       vessel cord)
 Face:                  Orbits appear          Kidneys:                Appear normal
                        normal
 Lips:                  Appears normal         Bladder:                Appears normal
 Thoracic:              Appears normal         Spine:                  Not well visualized
 Heart:                 Appears normal         Upper Extremities:      Appears normal
                        (4CH, axis, and
                        situs)
 RVOT:                  Not well visualized    Lower Extremities:      Appears normal
 LVOT:                  Appears normal

 Other:  Female gender Heels visualized. Technically difficult due to fetal
         position.

Fetal Evaluation (Fetus B)

 Num Of Fetuses:         2
 Fetal Heart Rate(bpm):  171
 Cardiac Activity:       Observed
 Fetal Lie:              Upper Right Fetus
 Presentation:           Cephalic
 Placenta:               Anterior Fundal
 P. Cord Insertion:      Visualized
 Membrane Desc:      Dividing Membrane seen - Dichorionic.

 Amniotic Fluid
 AFI FV:      Within normal limits

                             Largest Pocket(cm)

Biometry (Fetus B)

 BPD:      50.7  mm     G. Age:  21w 3d         96  %    CI:        82.06   %    70 - 86
                                                         FL/HC:      18.1   %    16.8 -
 HC:      176.6  mm     G. Age:  20w 1d         63  %    HC/AC:      1.08        1.09 -
 AC:      163.4  mm     G. Age:  21w 3d         91  %    FL/BPD:     62.9   %
 FL:       31.9  mm     G. Age:  20w 0d         50  %    FL/AC:      19.5   %    20 - 24
 HUM:      30.6  mm     G. Age:  20w 1d         63  %
 NFT:         4  mm

 Est. FW:     371  gm    0 lb 13 oz      61  %     FW Discordancy     0 \ 12 %
Gestational Age (Fetus B)

 LMP:           19w 5d        Date:  [DATE]                 EDD:   [DATE]
 U/S Today:     20w 5d                                        EDD:   [DATE]
 Best:          19w 5d     Det. By:  LMP  ([DATE])          EDD:   [DATE]
Anatomy (Fetus B)

 Cranium:               Appears normal         Aortic Arch:            Not well visualized
 Cavum:                 Appears normal         Ductal Arch:            Appears normal
 Ventricles:            Appears normal         Diaphragm:              Appears normal
 Choroid Plexus:        Appears normal         Stomach:                Appears normal, left
                                                                       sided
 Cerebellum:            Appears normal         Abdomen:                Appears normal
 Posterior Fossa:       Appears normal         Abdominal Wall:         Appears nml (cord
                                                                       insert, abd wall)
 Nuchal Fold:           Appears normal         Cord Vessels:           Appears normal (3
                                                                       vessel cord)
 Face:                  Appears normal         Kidneys:                Appear normal
                        (orbits and profile)
 Lips:                  Appears normal         Bladder:                Appears normal
 Thoracic:              Appears normal         Spine:                  Not well visualized
 Heart:                 Echogenic focus        Upper Extremities:      Appears normal
                        in LV
 RVOT:                  Appears normal         Lower Extremities:      Appears normal
 LVOT:                  Appears normal

 Other:  Male gender. Technically difficult due to fetal position.
Cervix Uterus Adnexa

 Cervix
 Measured transvaginally. Dilated
Comments

 I met with Ms. MEKA and reviewed today's findings of a
 dialated cervix. A sterile speculum exam was peformed as
 well that demonstrated a <1 cm dilation with membranes
 present. I discussed the risk of pretem birth. We discussed
 treatment options of vaginal progesterone vs. cerclage. We
 discussed that there are studies suggesting that a cerclage in
 twins can be harmful resulting in miscarriage. Secondly, I
 discussed with her providers and my partner. After discussion
 given the mild dilation offered nightly vaginal progesterone,
 which was prescribed today. We have scheduled Ms. MEKA
 to return in 4 weeks for growth.

 Precautions were provided.
Impression

 Normal interval growth.  No ultrasonic evidence of structural
 fetal anomalies.
 Short cervx- no measureable cervix obsered.
 Diamniotic dichorionic twin
 Suboptimal views of the fetal anatomy was obtained today.
Recommendations

 Follow up growth in 4 weeks
 Initiate vaginal progesterone.

## 2018-12-03 ENCOUNTER — Encounter: Payer: Self-pay | Admitting: Advanced Practice Midwife

## 2018-12-03 DIAGNOSIS — O3430 Maternal care for cervical incompetence, unspecified trimester: Secondary | ICD-10-CM | POA: Insufficient documentation

## 2018-12-08 MED ORDER — RUBIDIUM CHLORIDE POWD
1.00 | Status: DC
Start: 2018-12-09 — End: 2018-12-08

## 2018-12-08 MED ORDER — DOCUSATE SODIUM 100 MG PO CAPS
100.00 | ORAL_CAPSULE | ORAL | Status: DC
Start: 2018-12-08 — End: 2018-12-08

## 2018-12-08 MED ORDER — FAMOTIDINE 20 MG PO TABS
20.00 | ORAL_TABLET | ORAL | Status: DC
Start: 2018-12-08 — End: 2018-12-08

## 2018-12-08 MED ORDER — GENERIC EXTERNAL MEDICATION
Status: DC
Start: ? — End: 2018-12-08

## 2018-12-08 MED ORDER — DEXTROSE IN LACTATED RINGERS 5 % IV SOLN
125.00 | INTRAVENOUS | Status: DC
Start: ? — End: 2018-12-08

## 2018-12-08 MED ORDER — GUAIFENESIN ER 600 MG PO TB12
600.00 | ORAL_TABLET | ORAL | Status: DC
Start: ? — End: 2018-12-08

## 2018-12-08 MED ORDER — ERYTHROMYCIN BASE 250 MG PO TBEC
250.00 | DELAYED_RELEASE_TABLET | ORAL | Status: DC
Start: 2018-12-08 — End: 2018-12-08

## 2018-12-08 MED ORDER — INDOMETHACIN 25 MG PO CAPS
25.00 | ORAL_CAPSULE | ORAL | Status: DC
Start: 2018-12-08 — End: 2018-12-08

## 2018-12-08 MED ORDER — ONDANSETRON HCL 4 MG/2ML IJ SOLN
4.00 | INTRAMUSCULAR | Status: DC
Start: ? — End: 2018-12-08

## 2018-12-09 ENCOUNTER — Telehealth: Payer: Self-pay

## 2018-12-09 ENCOUNTER — Encounter: Payer: Medicaid Other | Admitting: Advanced Practice Midwife

## 2018-12-09 NOTE — Telephone Encounter (Signed)
Patient called letting the office know she is currently admitted. Patient states she went to her anatomy ultrasound and was found to be dilated and having contractions.   Patient made aware I will cancel her future appointment and she can call us when/if she is discharged. Patient agrees. Armandina Stammer RN

## 2018-12-11 ENCOUNTER — Encounter: Payer: Medicaid Other | Admitting: Family Medicine

## 2018-12-30 ENCOUNTER — Ambulatory Visit (HOSPITAL_COMMUNITY): Payer: Medicaid Other

## 2018-12-30 ENCOUNTER — Encounter (HOSPITAL_COMMUNITY): Payer: Self-pay

## 2019-04-01 ENCOUNTER — Ambulatory Visit (INDEPENDENT_AMBULATORY_CARE_PROVIDER_SITE_OTHER): Payer: Medicaid Other

## 2019-04-01 ENCOUNTER — Other Ambulatory Visit: Payer: Self-pay

## 2019-04-01 ENCOUNTER — Other Ambulatory Visit (HOSPITAL_COMMUNITY)
Admission: RE | Admit: 2019-04-01 | Discharge: 2019-04-01 | Disposition: A | Discharge status: Home / Self Care | Payer: Medicaid Other | Source: Ambulatory Visit | Attending: Medical | Admitting: Medical

## 2019-04-01 VITALS — BP 130/67 | HR 81 | Ht 62.5 in | Wt 170.0 lb

## 2019-04-01 DIAGNOSIS — N898 Other specified noninflammatory disorders of vagina: Secondary | ICD-10-CM

## 2019-04-01 NOTE — Progress Notes (Signed)
Patient seen and assessed by nursing staff during this encounter. I have reviewed the chart and agree with the documentation and plan.  Kerry Hough, PA-C 04/01/2019 11:45 AM

## 2019-04-01 NOTE — Progress Notes (Signed)
Pt states that she has been experiencing vaginal discharge and itching x 4 months. Pt states that she used over the counter Monistat but it did not provide any relief.Wet prep was sent to the lab.  chiquita l wilson, CMA

## 2019-04-03 LAB — CERVICOVAGINAL ANCILLARY ONLY
Bacterial vaginitis: POSITIVE — AB
Candida vaginitis: POSITIVE — AB

## 2019-04-06 ENCOUNTER — Telehealth: Payer: Self-pay

## 2019-04-06 DIAGNOSIS — B379 Candidiasis, unspecified: Secondary | ICD-10-CM

## 2019-04-06 DIAGNOSIS — B9689 Other specified bacterial agents as the cause of diseases classified elsewhere: Secondary | ICD-10-CM

## 2019-04-06 MED ORDER — METRONIDAZOLE 500 MG PO TABS: 500.0000 mg | ORAL_TABLET | Freq: Two times a day (BID) | ORAL | 0 refills | Status: DC

## 2019-04-06 MED ORDER — FLUCONAZOLE 150 MG PO TABS: 150.0000 mg | ORAL_TABLET | Freq: Once | ORAL | 0 refills | Status: AC

## 2019-04-06 NOTE — Telephone Encounter (Signed)
Called pt regarding positive BV and yeast  results. Left message for pt to call the office back. Flagyl and Diflucan was sent to the pharmacy. Fulton Merry l Cade Dashner, CMA

## 2019-04-07 NOTE — Telephone Encounter (Signed)
Pt called the office regarding results. Pt made aware that she has BV and yeast.Pt advised to take the Diflucan after she finishes the Flagyl. Pt also advised not to drink while taking the Flagyl because it can make her sick. Understanding was voiced.  chiquita l wilson, CMA

## 2019-05-04 ENCOUNTER — Telehealth: Payer: Self-pay

## 2019-05-04 MED ORDER — METRONIDAZOLE 0.75 % EX GEL: 1.0000 "application " | Freq: Two times a day (BID) | CUTANEOUS | 0 refills | Status: AC

## 2019-05-04 NOTE — Telephone Encounter (Signed)
Patient states she was seen three weeks ago and does not feel the flagyl help get rid of her BV. 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   Will send in Rx but made patient aware if she has no symptoms relief she will need to come back in for reevaluation with a provider. Patient states understanding. Kathrene Alu RN

## 2019-05-06 ENCOUNTER — Other Ambulatory Visit: Payer: Self-pay

## 2019-05-06 DIAGNOSIS — B9689 Other specified bacterial agents as the cause of diseases classified elsewhere: Secondary | ICD-10-CM

## 2019-05-06 MED ORDER — METRONIDAZOLE 500 MG PO TABS: 500.0000 mg | ORAL_TABLET | Freq: Two times a day (BID) | ORAL | 0 refills | Status: AC
